# Patient Record
Sex: Female | Born: 2000 | Race: White | Hispanic: No | Marital: Single | Smoking: Never smoker
Health system: Southern US, Community
[De-identification: ages and names within clinical notes are randomized; demographics above are authoritative.]

## PROBLEM LIST (undated history)

## (undated) DIAGNOSIS — L709 Acne, unspecified: Secondary | ICD-10-CM

## (undated) HISTORY — PX: ANTERIOR CRUCIATE LIGAMENT REPAIR: SHX115

---
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
## 2018-02-17 ENCOUNTER — Other Ambulatory Visit: Payer: Self-pay | Admitting: Certified Nurse Midwife

## 2018-02-17 DIAGNOSIS — Z30011 Encounter for initial prescription of contraceptive pills: Secondary | ICD-10-CM

## 2018-02-17 NOTE — Telephone Encounter (Signed)
Appointment scheduled, patient aware and refill sent.

## 2018-02-17 NOTE — Telephone Encounter (Signed)
Needs phone call will refill one month only! If schedules and seen.

## 2018-02-17 NOTE — Telephone Encounter (Signed)
Medication refill request: Junel OCP Last AEX:  10/30/2016 Next AEX: not scheduled Last MMG (if hormonal medication request): n/a Refill authorized: #30, 0 refill until seen

## 2018-02-27 ENCOUNTER — Other Ambulatory Visit: Payer: Self-pay

## 2018-02-27 ENCOUNTER — Encounter: Payer: Self-pay | Admitting: Certified Nurse Midwife

## 2018-02-27 ENCOUNTER — Ambulatory Visit: Payer: BLUE CROSS/BLUE SHIELD | Admitting: Certified Nurse Midwife

## 2018-02-27 VITALS — BP 104/64 | HR 64 | Resp 16 | Ht 65.25 in | Wt 138.0 lb

## 2018-02-27 DIAGNOSIS — Z3041 Encounter for surveillance of contraceptive pills: Secondary | ICD-10-CM

## 2018-02-27 DIAGNOSIS — Z01419 Encounter for gynecological examination (general) (routine) without abnormal findings: Secondary | ICD-10-CM | POA: Diagnosis not present

## 2018-02-27 MED ORDER — NORETHIN ACE-ETH ESTRAD-FE 1-20 MG-MCG PO TABS: 1.0000 | ORAL_TABLET | Freq: Every day | ORAL | 11 refills | Status: DC

## 2018-02-27 NOTE — Patient Instructions (Signed)
Oral Contraception Use Oral contraceptive pills (OCPs) are medicines taken to prevent pregnancy. OCPs work by preventing the ovaries from releasing eggs. The hormones in OCPs also cause the cervical mucus to thicken, preventing the sperm from entering the uterus. The hormones also cause the uterine lining to become thin, not allowing a fertilized egg to attach to the inside of the uterus. OCPs are highly effective when taken exactly as prescribed. However, OCPs do not prevent sexually transmitted diseases (STDs). Safe sex practices, such as using condoms along with an OCP, can help prevent STDs. Before taking OCPs, you may have a physical exam and Pap test. Your health care provider may also order blood tests if necessary. Your health care provider will make sure you are a good candidate for oral contraception. Discuss with your health care provider the possible side effects of the OCP you may be prescribed. When starting an OCP, it can take 2 to 3 months for the body to adjust to the changes in hormone levels in your body. How to take oral contraceptive pills Your health care provider may advise you on how to start taking the first cycle of OCPs. Otherwise, you can:  Start on day 1 of your menstrual period. You will not need any backup contraceptive protection with this start time.  Start on the first Sunday after your menstrual period or the day you get your prescription. In these cases, you will need to use backup contraceptive protection for the first week.  Start the pill at any time of your cycle. If you take the pill within 5 days of the start of your period, you are protected against pregnancy right away. In this case, you will not need a backup form of birth control. If you start at any other time of your menstrual cycle, you will need to use another form of birth control for 7 days. If your OCP is the type called a minipill, it will protect you from pregnancy after taking it for 2 days (48  hours).  After you have started taking OCPs:  If you forget to take 1 pill, take it as soon as you remember. Take the next pill at the regular time.  If you miss 2 or more pills, call your health care provider because different pills have different instructions for missed doses. Use backup birth control until your next menstrual period starts.  If you use a 28-day pack that contains inactive pills and you miss 1 of the last 7 pills (pills with no hormones), it will not matter. Throw away the rest of the non-hormone pills and start a new pill pack.  No matter which day you start the OCP, you will always start a new pack on that same day of the week. Have an extra pack of OCPs and a backup contraceptive method available in case you miss some pills or lose your OCP pack. Follow these instructions at home:  Do not smoke.  Always use a condom to protect against STDs. OCPs do not protect against STDs.  Use a calendar to mark your menstrual period days.  Read the information and directions that came with your OCP. Talk to your health care provider if you have questions. Contact a health care provider if:  You develop nausea and vomiting.  You have abnormal vaginal discharge or bleeding.  You develop a rash.  You miss your menstrual period.  You are losing your hair.  You need treatment for mood swings or depression.  You   get dizzy when taking the OCP.  You develop acne from taking the OCP.  You become pregnant. Get help right away if:  You develop chest pain.  You develop shortness of breath.  You have an uncontrolled or severe headache.  You develop numbness or slurred speech.  You develop visual problems.  You develop pain, redness, and swelling in the legs. This information is not intended to replace advice given to you by your health care provider. Make sure you discuss any questions you have with your health care provider. Document Released: 04/25/2011 Document  Revised: 10/12/2015 Document Reviewed: 10/25/2012 Elsevier Interactive Patient Education  2017 [employer]  

## 2018-02-27 NOTE — Progress Notes (Signed)
17 y.o. G0P0000 Single  Caucasian Fe here for annual exam. Periods sporadic with OCP use, usually every 4 months, not taking continuous. Reports no missed pills or warning signs with use. No partner change and condom use. No STD concerns or testing needed. Sees Urgent care if needed. Just finished mid term exams!  Patient's last menstrual period was 01/06/2018 (exact date).          Sexually active: Yes.    The current method of family planning is OCP (estrogen/progesterone).    Exercising: Yes.    running & gym Smoker:  no  Review of Systems  Constitutional: Negative.   HENT: Negative.   Eyes: Negative.   Respiratory: Negative.   Cardiovascular: Negative.   Gastrointestinal: Negative.   Genitourinary: Negative.   Musculoskeletal: Negative.   Skin: Negative.   Neurological: Negative.   Endo/Heme/Allergies: Negative.   Psychiatric/Behavioral: Negative.     Health Maintenance: Pap:  none History of Abnormal Pap: no MMG:  none Self Breast exams: yes Colonoscopy:  none BMD:   none TDaP:  2011 Shingles: no Pneumonia: no Hep C and HIV: not done Labs: if needed   reports that she has never smoked. She has never used smokeless tobacco. She reports that she drinks about 1.0 standard drinks of alcohol per week. She reports that she does not use drugs.  Past Medical History:  Diagnosis Date  . Acne     Past Surgical History:  Procedure Laterality Date  . ANTERIOR CRUCIATE LIGAMENT REPAIR Right 09/15/2015   Procedure: RIGHT KNEE ARTHROSCOPY, ANTERIOR CRUCIATE LIGAMENT (ACL) RECONSTRUCTION WITH HAMSTRING GRAFT;  Surgeon: Cammy Copa, MD;  Location: [facility] OR;  Service: Orthopedics;  Laterality: Right;  . ANTERIOR CRUCIATE LIGAMENT REPAIR     right side    Current Outpatient Medications  Medication Sig Dispense Refill  . adapalene (DIFFERIN) 0.1 % gel Apply 1 application topically at bedtime.    . betamethasone dipropionate (DIPROLENE) 0.05 % cream APPLY TWICE A DAY TO  AFFECTED AREAS OF THE SKIN  1  . JUNEL FE 1/20 1-20 MG-MCG tablet TAKE 1 TABLET BY MOUTH ONCE DAILY AS DIRECTED 30 tablet 0  . nitrofurantoin, macrocrystal-monohydrate, (MACROBID) 100 MG capsule One capsule past coitus for UTI prevention, if symptoms persist twice daily x 3 days 30 capsule 1   No current facility-administered medications for this visit.     History reviewed. No pertinent family history.  ROS:  Pertinent items are noted in HPI.  Otherwise, a comprehensive ROS was negative.  Exam:   BP 104/64   Pulse 64   Resp 16   Ht 5' 5.25" (1.657 m)   Wt 138 lb (62.6 kg)   LMP 01/06/2018 (Exact Date)   BMI 22.79 kg/m  Height: 5' 5.25" (165.7 cm) Ht Readings from Last 3 Encounters:  02/27/18 5' 5.25" (1.657 m) (65 %, Z= 0.38)*  10/02/17 5' 5.25" (1.657 m) (65 %, Z= 0.39)*  03/06/17 5' 5.25" (1.657 m) (66 %, Z= 0.40)*   * Growth percentiles are based on CDC (Girls, 2-20 Years) data.    General appearance: alert, cooperative and appears stated age Head: Normocephalic, without obvious abnormality, atraumatic Neck: no adenopathy, supple, symmetrical, trachea midline and thyroid normal to inspection and palpation Lungs: clear to auscultation bilaterally Breasts: normal appearance, no masses or tenderness, No nipple retraction or dimpling, No nipple discharge or bleeding, No axillary or supraclavicular adenopathy Heart: regular rate and rhythm Abdomen: soft, non-tender; no masses,  no organomegaly Extremities: extremities normal, atraumatic, no  cyanosis or edema Skin: Skin color, texture, turgor normal. No rashes or lesions Lymph nodes: Cervical, supraclavicular, and axillary nodes normal. No abnormal inguinal nodes palpated Neurologic: Grossly normal   Pelvic: External genitalia:  no lesions              Urethra:  normal appearing urethra with no masses, tenderness or lesions              Bartholin's and Skene's: normal                 Vagina: normal appearing vagina with  normal color and discharge, no lesions              Cervix: no cervical motion tenderness, no lesions and nulliparous appearance              Pap taken: No. Bimanual Exam:  Uterus:  normal size, contour, position, consistency, mobility, non-tender and anteflexed              Adnexa: normal adnexa and no mass, fullness, tenderness               Rectovaginal: Confirms               Anus:  normal appearance no lesions  Chaperone present: yes  A:  Well Woman with normal exam  Contraception OCP desired  History of post coital UTI has Macrobid  P:   Reviewed health and wellness pertinent to exam  Risks/benefits/warning signs with OCP reviewed, desires continuance.  Will call if needs refill.  Pap smear: no   counseled on breast self exam, STD prevention, HIV risk factors and prevention, feminine hygiene, use and side effects of OCP's, adequate intake of calcium and vitamin D, diet and exercise  return annually or prn  An After Visit Summary was printed and given to the patient.

## 2019-01-18 ENCOUNTER — Other Ambulatory Visit: Payer: Self-pay | Admitting: Certified Nurse Midwife

## 2019-01-18 DIAGNOSIS — Z3041 Encounter for surveillance of contraceptive pills: Secondary | ICD-10-CM

## 2019-04-05 ENCOUNTER — Other Ambulatory Visit: Payer: Self-pay

## 2019-04-09 ENCOUNTER — Other Ambulatory Visit: Payer: Self-pay

## 2019-04-09 ENCOUNTER — Encounter: Payer: Self-pay | Admitting: Certified Nurse Midwife

## 2019-04-09 ENCOUNTER — Ambulatory Visit (INDEPENDENT_AMBULATORY_CARE_PROVIDER_SITE_OTHER): Payer: BLUE CROSS/BLUE SHIELD | Admitting: Certified Nurse Midwife

## 2019-04-09 VITALS — BP 104/64 | HR 68 | Temp 97.0°F | Resp 16 | Ht 65.0 in | Wt 140.0 lb

## 2019-04-09 DIAGNOSIS — Z3041 Encounter for surveillance of contraceptive pills: Secondary | ICD-10-CM | POA: Diagnosis not present

## 2019-04-09 DIAGNOSIS — Z113 Encounter for screening for infections with a predominantly sexual mode of transmission: Secondary | ICD-10-CM | POA: Diagnosis not present

## 2019-04-09 DIAGNOSIS — Z01419 Encounter for gynecological examination (general) (routine) without abnormal findings: Secondary | ICD-10-CM

## 2019-04-09 MED ORDER — NORETHIN ACE-ETH ESTRAD-FE 1-20 MG-MCG PO TABS: 1.0000 | ORAL_TABLET | Freq: Every day | ORAL | 4 refills | Status: DC

## 2019-04-09 NOTE — Progress Notes (Signed)
18 y.o. G0P0000 Single  Caucasian Fe here for annual exam. Periods normal every 3 months with continuous use OCP for cycle control. No partner change but STD screening requested. 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 Had UTI one month ago and used Macrobid post coital as instructed worked well, desires update of Rx. No other health issues today.  Patient's last menstrual period was 03/30/2019 (exact date).          Sexually active: Yes.    The current method of family planning is OCP (estrogen/progesterone).    Exercising: Yes.    running Smoker:  no  Review of Systems  Constitutional: Negative.   HENT: Negative.   Eyes: Negative.   Respiratory: Negative.   Cardiovascular: Negative.   Gastrointestinal: Negative.   Genitourinary: Negative.   Musculoskeletal: Negative.   Skin: Negative.   Neurological: Negative.   Endo/Heme/Allergies: Negative.   Psychiatric/Behavioral: Negative.     Health Maintenance: Pap:  none History of Abnormal Pap: no MMG:  none Self Breast exams: yes Colonoscopy:  none BMD:   none TDaP:  2011 Shingles: no Pneumonia: no Hep C and HIV: not done Labs: yes   reports that she has never smoked. She has never used smokeless tobacco. She reports current alcohol use of about 1.0 standard drinks of alcohol per week. She reports that she does not use drugs.  Past Medical History:  Diagnosis Date  . Acne     Past Surgical History:  Procedure Laterality Date  . ANTERIOR CRUCIATE LIGAMENT REPAIR Right 09/15/2015   Procedure: RIGHT KNEE ARTHROSCOPY, ANTERIOR CRUCIATE LIGAMENT (ACL) RECONSTRUCTION WITH HAMSTRING GRAFT;  Surgeon: Cammy Copa, MD;  Location: [facility] OR;  Service: Orthopedics;  Laterality: Right;  . ANTERIOR CRUCIATE LIGAMENT REPAIR     right side    Current Outpatient Medications  Medication Sig Dispense Refill  . adapalene (DIFFERIN) 0.1 % gel Apply 1 application topically at bedtime.    . betamethasone dipropionate (DIPROLENE) 0.05 % cream  APPLY TWICE A DAY TO AFFECTED AREAS OF THE SKIN  1  . nitrofurantoin, macrocrystal-monohydrate, (MACROBID) 100 MG capsule One capsule past coitus for UTI prevention, if symptoms persist twice daily x 3 days 30 capsule 1  . norethindrone-ethinyl estradiol (JUNEL FE 1/20) 1-20 MG-MCG tablet Take 1 tablet by mouth daily. as directed 30 tablet 11   No current facility-administered medications for this visit.     History reviewed. No pertinent family history.  ROS:  Pertinent items are noted in HPI.  Otherwise, a comprehensive ROS was negative.  Exam:   BP 104/64   Pulse 68   Temp (!) 97 F (36.1 C) (Skin)   Resp 16   Ht 5\' 5"  (1.651 m)   Wt 140 lb (63.5 kg)   LMP 03/30/2019 (Exact Date)   BMI 23.30 kg/m  Height: 5\' 5"  (165.1 cm) Ht Readings from Last 3 Encounters:  04/09/19 5\' 5"  (1.651 m)  02/27/18 5' 5.25" (1.657 m) (65 %, Z= 0.38)*  10/02/17 5' 5.25" (1.657 m) (65 %, Z= 0.39)*   * Growth percentiles are based on CDC (Girls, 2-20 Years) data.    General appearance: alert, cooperative and appears stated age Head: Normocephalic, without obvious abnormality, atraumatic Neck: no adenopathy, supple, symmetrical, trachea midline and thyroid normal to inspection and palpation Lungs: clear to auscultation bilaterally Breasts: normal appearance, no masses or tenderness, No nipple retraction or dimpling, No nipple discharge or bleeding, No axillary or supraclavicular adenopathy Heart: regular rate and rhythm Abdomen: soft, non-tender; no  masses,  no organomegaly Extremities: extremities normal, atraumatic, no cyanosis or edema Skin: Skin color, texture, turgor normal. No rashes or lesions Lymph nodes: Cervical, supraclavicular, and axillary nodes normal. No abnormal inguinal nodes palpated Neurologic: Grossly normal   Pelvic: External genitalia:  no lesions              Urethra:  normal appearing urethra with no masses, tenderness or lesions              Bartholin's and Skene's:  normal                 Vagina: normal appearing vagina with normal color and discharge, no lesions              Cervix: no bleeding following Pap, no cervical motion tenderness, no lesions and nulliparous appearance              Pap taken: No. Bimanual Exam:  Uterus:  normal size, contour, position, consistency, mobility, non-tender and anteverted              Adnexa: normal adnexa and no mass, fullness, tenderness               Rectovaginal: Confirms               Anus:  normal sphincter tone, no lesions  Chaperone present: yes  A:  Well Woman with normal exam  Contraception OCP continuous use desired  Post coital UTI history, Macrobid working well, needs Rx update  STD screening    P:   Reviewed health and wellness pertinent to exam  Risks/benefits/warning signs given and need to have cycle every 3rd month to avoid BTB.  Rx Junel 1/20 Fe see order with instructions  Rx Macrobid see order with instructions  Labs: Affirm, Gc/Chlamydia, HIV,RPR, Hep C  Pap smear: no age 39   counseled on breast self exam, STD prevention, HIV risk factors and prevention, feminine hygiene, use and side effects of OCP's, adequate intake of calcium and vitamin D, diet and exercise  return annually or prn  An After Visit Summary was printed and given to the patient.

## 2019-04-09 NOTE — Patient Instructions (Signed)
General topics  Next pap or exam is  due in 1 year Take a Women's multivitamin Take 1200 mg. of calcium daily - prefer dietary If any concerns in interim to call back  Breast Self-Awareness Practicing breast self-awareness may pick up problems early, prevent significant medical complications, and possibly save your life. By practicing breast self-awareness, you can become familiar with how your breasts look and feel and if your breasts are changing. This allows you to notice changes early. It can also offer you some reassurance that your breast health is good. One way to learn what is normal for your breasts and whether your breasts are changing is to do a breast self-exam. If you find a lump or something that was not present in the past, it is best to contact your caregiver right away. Other findings that should be evaluated by your caregiver include nipple discharge, especially if it is bloody; skin changes or reddening; areas where the skin seems to be pulled in (retracted); or new lumps and bumps. Breast pain is seldom associated with cancer (malignancy), but should also be evaluated by a caregiver. BREAST SELF-EXAM The best time to examine your breasts is 5 7 days after your menstrual period is over.  ExitCare Patient Information 2013 [place].   Exercise to Stay Healthy Exercise helps you become and stay healthy. EXERCISE IDEAS AND TIPS Choose exercises that:  You enjoy.  Fit into your day. You do not need to exercise really hard to be healthy. You can do exercises at a slow or medium level and stay healthy. You can:  Stretch before and after working out.  Try yoga, Pilates, or tai chi.  Lift weights.  Walk fast, swim, jog, run, climb stairs, bicycle, dance, or rollerskate.  Take aerobic classes. Exercises that burn about 150 calories:  Running 1  miles in 15 minutes.  Playing volleyball for 45 to 60 minutes.  Washing and waxing a car for 45 to 60 minutes.   Playing touch football for 45 minutes.  Walking 1  miles in 35 minutes.  Pushing a stroller 1  miles in 30 minutes.  Playing basketball for 30 minutes.  Raking leaves for 30 minutes.  Bicycling 5 miles in 30 minutes.  Walking 2 miles in 30 minutes.  Dancing for 30 minutes.  Shoveling snow for 15 minutes.  Swimming laps for 20 minutes.  Walking up stairs for 15 minutes.  Bicycling 4 miles in 15 minutes.  Gardening for 30 to 45 minutes.  Jumping rope for 15 minutes.  Washing windows or floors for 45 to 60 minutes. Document Released: 06/08/2010 Document Revised: 07/29/2011 Document Reviewed: 06/08/2010 [facility] Patient Information 2013 [place].   Other topics ( that may be useful information):    Sexually Transmitted Disease Sexually transmitted disease (STD) refers to any infection that is passed from person to person during sexual activity. This may happen by way of saliva, semen, blood, vaginal mucus, or urine. Common STDs include:  Gonorrhea.  Chlamydia.  Syphilis.  HIV/AIDS.  Genital herpes.  Hepatitis B and C.  Trichomonas.  Human papillomavirus (HPV).  Pubic lice. CAUSES  An STD may be spread by bacteria, virus, or parasite. A person can get an STD by:  Sexual intercourse with an infected person.  Sharing sex toys with an infected person.  Sharing needles with an infected person.  Having intimate contact with the genitals, mouth, or rectal areas of an infected person. SYMPTOMS  Some people may not have any symptoms, but  they can still pass the infection to others. Different STDs have different symptoms. Symptoms include:  Painful or bloody urination.  Pain in the pelvis, abdomen, vagina, anus, throat, or eyes.  Skin rash, itching, irritation, growths, or sores (lesions). These usually occur in the genital or anal area.  Abnormal vaginal discharge.  Penile discharge in men.  Soft, flesh-colored skin growths in the genital  or anal area.  Fever.  Pain or bleeding during sexual intercourse.  Swollen glands in the groin area.  Yellow skin and eyes (jaundice). This is seen with hepatitis. DIAGNOSIS  To make a diagnosis, your caregiver may:  Take a medical history.  Perform a physical exam.  Take a specimen (culture) to be examined.  Examine a sample of discharge under a microscope.  Perform blood test TREATMENT   Chlamydia, gonorrhea, trichomonas, and syphilis can be cured with antibiotic medicine.  Genital herpes, hepatitis, and HIV can be treated, but not cured, with prescribed medicines. The medicines will lessen the symptoms.  Genital warts from HPV can be treated with medicine or by freezing, burning (electrocautery), or surgery. Warts may come back.  HPV is a virus and cannot be cured with medicine or surgery.However, abnormal areas may be followed very closely by your caregiver and may be removed from the cervix, vagina, or vulva through office procedures or surgery. If your diagnosis is confirmed, your recent sexual partners need treatment. This is true even if they are symptom-free or have a negative culture or evaluation. They should not have sex until their caregiver says it is okay. HOME CARE INSTRUCTIONS  All sexual partners should be informed, tested, and treated for all STDs.  Take your antibiotics as directed. Finish them even if you start to feel better.  Only take over-the-counter or prescription medicines for pain, discomfort, or fever as directed by your caregiver.  Rest.  Eat a balanced diet and drink enough fluids to keep your urine clear or pale yellow.  Do not have sex until treatment is completed and you have followed up with your caregiver. STDs should be checked after treatment.  Keep all follow-up appointments, Pap tests, and blood tests as directed by your caregiver.  Only use latex condoms and water-soluble lubricants during sexual activity. Do not use petroleum  jelly or oils.  Avoid alcohol and illegal drugs.  Get vaccinated for HPV and hepatitis. If you have not received these vaccines in the past, talk to your caregiver about whether one or both might be right for you.  Avoid risky sex practices that can break the skin. The only way to avoid getting an STD is to avoid all sexual activity.Latex condoms and dental dams (for oral sex) will help lessen the risk of getting an STD, but will not completely eliminate the risk. SEEK MEDICAL CARE IF:   You have a fever.  You have any new or worsening symptoms. Document Released: 07/27/2002 Document Revised: 07/29/2011 Document Reviewed: 08/03/2010 [facility] Patient Information 2013 [place].    Domestic Abuse You are being battered or abused if someone close to you hits, pushes, or physically hurts you in any way. You also are being abused if you are forced into activities. You are being sexually abused if you are forced to have sexual contact of any kind. You are being emotionally abused if you are made to feel worthless or if you are constantly threatened. It is important to remember that help is available. No one has the right to abuse you. PREVENTION OF FURTHER  ABUSE  Learn the warning signs of danger. This varies with situations but may include: the use of alcohol, threats, isolation from friends and family, or forced sexual contact. Leave if you feel that violence is going to occur.  If you are attacked or beaten, report it to the police so the abuse is documented. You do not have to press charges. The police can protect you while you or the attackers are leaving. Get the officer's name and badge number and a copy of the report.  Find someone you can trust and tell them what is happening to you: your caregiver, a nurse, clergy member, close friend or family member. Feeling ashamed is natural, but remember that you have done nothing wrong. No one deserves abuse. Document Released: 05/03/2000  Document Revised: 07/29/2011 Document Reviewed: 07/12/2010 [facility] Patient Information 2013 [place].    How Much is Too Much Alcohol? Drinking too much alcohol can cause injury, accidents, and health problems. These types of problems can include:   Car crashes.  Falls.  Family fighting (domestic violence).  Drowning.  Fights.  Injuries.  Burns.  Damage to certain organs.  Having a baby with birth defects. ONE DRINK CAN BE TOO MUCH WHEN YOU ARE:  Working.  Pregnant or breastfeeding.  Taking medicines. Ask your doctor.  Driving or planning to drive. If you or someone you know has a drinking problem, get help from a doctor.  Document Released: 03/02/2009 Document Revised: 07/29/2011 Document Reviewed: 03/02/2009 [facility] Patient Information 2013 [place].   Smoking Hazards Smoking cigarettes is extremely bad for your health. Tobacco smoke has over 200 known poisons in it. There are over 60 chemicals in tobacco smoke that cause cancer. Some of the chemicals found in cigarette smoke include:   Cyanide.  Benzene.  Formaldehyde.  Methanol (wood alcohol).  Acetylene (fuel used in welding torches).  Ammonia. Cigarette smoke also contains the poisonous gases nitrogen oxide and carbon monoxide.  Cigarette smokers have an increased risk of many serious medical problems and Smoking causes approximately:  90% of all lung cancer deaths in men.  80% of all lung cancer deaths in women.  90% of deaths from chronic obstructive lung disease. Compared with nonsmokers, smoking increases the risk of:  Coronary heart disease by 2 to 4 times.  Stroke by 2 to 4 times.  Men developing lung cancer by 23 times.  Women developing lung cancer by 13 times.  Dying from chronic obstructive lung diseases by 12 times.  . Smoking is the most preventable cause of death and disease in our society.  WHY IS SMOKING ADDICTIVE?  Nicotine is the chemical agent in  tobacco that is capable of causing addiction or dependence.  When you smoke and inhale, nicotine is absorbed rapidly into the bloodstream through your lungs. Nicotine absorbed through the lungs is capable of creating a powerful addiction. Both inhaled and non-inhaled nicotine may be addictive.  Addiction studies of cigarettes and spit tobacco show that addiction to nicotine occurs mainly during the teen years, when young people begin using tobacco products. WHAT ARE THE BENEFITS OF QUITTING?  There are many health benefits to quitting smoking.   Likelihood of developing cancer and heart disease decreases. Health improvements are seen almost immediately.  Blood pressure, pulse rate, and breathing patterns start returning to normal soon after quitting. QUITTING SMOKING   American Lung Association - 1-800-LUNGUSA  American Cancer Society - 1-800-ACS-2345 Document Released: 06/13/2004 Document Revised: 07/29/2011 Document Reviewed: 02/15/2009 [facility] Patient Information 2013 [place],  [employer].   Stress Management Stress is a state of physical or mental tension that often results from changes in your life or normal routine. Some common causes of stress are:  Death of a loved one.  Injuries or severe illnesses.  Getting fired or changing jobs.  Moving into a new home. Other causes may be:  Sexual problems.  Business or financial losses.  Taking on a large debt.  Regular conflict with someone at home or at work.  Constant tiredness from lack of sleep. It is not just bad things that are stressful. It may be stressful to:  Win the lottery.  Get married.  Buy a new car. The amount of stress that can be easily tolerated varies from person to person. Changes generally cause stress, regardless of the types of change. Too much stress can affect your health. It may lead to physical or emotional problems. Too little stress (boredom) may also become stressful. SUGGESTIONS TO REDUCE  STRESS:  Talk things over with your family and friends. It often is helpful to share your concerns and worries. If you feel your problem is serious, you may want to get help from a professional counselor.  Consider your problems one at a time instead of lumping them all together. Trying to take care of everything at once may seem impossible. List all the things you need to do and then start with the most important one. Set a goal to accomplish 2 or 3 things each day. If you expect to do too many in a single day you will naturally fail, causing you to feel even more stressed.  Do not use alcohol or drugs to relieve stress. Although you may feel better for a short time, they do not remove the problems that caused the stress. They can also be habit forming.  Exercise regularly - at least 3 times per week. Physical exercise can help to relieve that "uptight" feeling and will relax you.  The shortest distance between despair and hope is often a good night's sleep.  Go to bed and get up on time allowing yourself time for appointments without being rushed.  Take a short "time-out" period from any stressful situation that occurs during the day. Close your eyes and take some deep breaths. Starting with the muscles in your face, tense them, hold it for a few seconds, then relax. Repeat this with the muscles in your neck, shoulders, hand, stomach, back and legs.  Take good care of yourself. Eat a balanced diet and get plenty of rest.  Schedule time for having fun. Take a break from your daily routine to relax. HOME CARE INSTRUCTIONS   Call if you feel overwhelmed by your problems and feel you can no longer manage them on your own.  Return immediately if you feel like hurting yourself or someone else. Document Released: 10/30/2000 Document Revised: 07/29/2011 Document Reviewed: 06/22/2007 Exit[employer] Patient Information 2013 [employer].   Preventing Sexually Transmitted Infections, Adult Sexually  transmitted infections (STIs) are diseases that are passed (transmitted) from person to person through bodily fluids exchanged during sex or sexual contact. Bodily fluids include saliva, semen, blood, vaginal mucus, and urine. You may have an increased risk for developing an STI if you have unprotected oral, vaginal, or anal sex. Some common STIs include:  Herpes.  Hepatitis B.  Chlamydia.  Gonorrhea.  Syphilis.  HPV (human papillomavirus).  HIV (human immunodeficiency virus), the virus that can cause AIDS (acquired immunodeficiency syndrome). How can I protect myself from   sexually transmitted infections? The only way to completely prevent STIs is not to have sex of any kind (practice abstinence). This includes oral, vaginal, or anal sex. If you are sexually active, take these actions to lower your risk of getting an STI:  Have only one sex partner (be monogamous) or limit the number of sexual partners you have.  Stay up-to-date on immunizations. Certain vaccines can lower your risk of getting certain STIs, such as: ? Hepatitis A and B vaccines. You may have been vaccinated as a young child, but likely need a booster shot as a teen or young adult. ? HPV vaccine.  Use methods that prevent the exchange of body fluids between partners (barrier protection) every time you have sex. Barrier protection can be used during oral, vaginal, or anal sex. Commonly used barrier methods include: ? Female condom. ? Female condom. ? Dental dam.  Get tested regularly for STIs. Have your sexual partner get tested regularly as well.  Avoid mixing alcohol, drugs, and sex. Alcohol and drug use can affect your ability to make good decisions and can lead to risky sexual behaviors.  Ask your health care provider about taking pre-exposure prophylaxis (PrEP) to prevent HIV infection if you: ? Have a HIV-positive sexual partner. ? Have multiple sexual partners or partners who do not know their HIV status, and  do not regularly use a condom during sex. ? Use injection drugs and share needles. Birth control pills, injections, implants, and intrauterine devices (IUDs) do not protect against STIs. To prevent both STIs and pregnancy, always use a condom with another form of birth control. Some STIs, such as herpes, are spread through skin to skin contact. A condom does not protect you from getting such STIs. If you or your partner have herpes and there is an active flare with open sores, avoid all sexual contact. Why are these changes important? Taking steps to practice safe sex protects you and others. Many STIs can be cured. However, some STIs are not curable and will affect you for the rest of your life. STIs can be passed on to another person even if you do not have symptoms. What can happen if changes are not made? Certain STIs may:  Require you to take medicine for the rest of your life.  Affect your ability to have children (your fertility).  Increase your risk for developing another STI or certain serious health conditions, such as: ? Cervical cancer. ? Head and neck cancer. ? Pelvic inflammatory disease (PID) in women. ? Organ damage or damage to other parts of your body, if the infection spreads.  Be passed to a baby during childbirth. How are sexually transmitted infections treated? If you or your partner know or think that you may have an STI:  Talk with your health care provider about what can be done to treat it. Some STIs can be treated and cured with medicines.  For curable STIs, you and your partner should avoid sex during treatment and for several days after treatment is complete.  You and your partner should both be treated at the same time, if there is any chance that your partner is infected as well. If you get treatment but your partner does not, your partner can re-infect you when you resume sexual contact.  Do not have unprotected sex. Where to find more information Learn  more about sexually transmitted diseases and infections from:  Centers for Disease Control and Prevention: ? More information about specific STIs: www.cdc.gov/std ? Find   places to get sexual health counseling and treatment for free or for a low cost: gettested.cdc.gov  U.S. Department of Health and Human Services: www.womenshealth.gov/publications/our-publications/fact-sheet/sexually-transmitted-infections.html Summary  The only way to completely prevent STIs is not to have sex (practice abstinence), including oral, vaginal, or anal sex.  STIs can spread through saliva, semen, blood, vaginal mucus, urine, or sexual contact.  If you do have sex, limit your number of sexual partners and use a barrier protection method every time you have sex.  If you develop an STI, get treated right away and ask your partner to be treated as well. Do not resume having sex until both of you have completed treatment for the STI. This information is not intended to replace advice given to you by your health care provider. Make sure you discuss any questions you have with your health care provider. Document Released: 05/02/2016 Document Revised: 10/10/2017 Document Reviewed: 05/02/2016 Elsevier Patient Education  2020 Elsevier Inc.  

## 2019-04-10 LAB — RPR: RPR Ser Ql: NONREACTIVE

## 2019-04-10 LAB — HIV ANTIBODY (ROUTINE TESTING W REFLEX): HIV Screen 4th Generation wRfx: NONREACTIVE

## 2019-04-10 LAB — VAGINITIS/VAGINOSIS, DNA PROBE
Candida Species: POSITIVE — AB
Gardnerella vaginalis: POSITIVE — AB
Trichomonas vaginosis: NEGATIVE

## 2019-04-10 LAB — HEPATITIS C ANTIBODY: Hep C Virus Ab: <0.1 s/co ratio (ref 0.0–0.9)

## 2019-04-12 ENCOUNTER — Telehealth: Payer: Self-pay

## 2019-04-12 NOTE — Telephone Encounter (Signed)
-----   Message from Regina Eck, CNM sent at 04/11/2019  3:56 PM EST ----- Notify patient that her vaginal screening was negative for trichomonas, but positive for BV and yeast. She will need Rx Metrogel Bid x 7 and Rx Diflucan 150 mg one today and repeat one in 5 days.  She should expect increase in vaginal discharge during treatment. She can do [employer] bath to help with this. Chlamydia and Gonorrhea pending

## 2019-04-12 NOTE — Telephone Encounter (Signed)
Left message for call back.

## 2019-04-13 NOTE — Telephone Encounter (Signed)
Left message for call back.

## 2019-04-14 LAB — GC/CHLAMYDIA PROBE AMP
Chlamydia trachomatis, NAA: NEGATIVE
Neisseria Gonorrhoeae by PCR: NEGATIVE

## 2019-04-14 NOTE — Telephone Encounter (Signed)
Left message for call back.

## 2019-04-19 MED ORDER — FLUCONAZOLE 150 MG PO TABS: ORAL_TABLET | ORAL | 0 refills | Status: DC

## 2019-04-19 MED ORDER — METRONIDAZOLE 0.75 % VA GEL: 1.0000 | Freq: Two times a day (BID) | VAGINAL | 0 refills | Status: AC

## 2019-04-19 NOTE — Telephone Encounter (Signed)
Patient notified of results & rxs sent to pharmacy. 

## 2019-05-24 ENCOUNTER — Ambulatory Visit: Payer: BLUE CROSS/BLUE SHIELD | Attending: Internal Medicine

## 2019-05-24 DIAGNOSIS — Z20822 Contact with and (suspected) exposure to covid-19: Secondary | ICD-10-CM

## 2019-05-25 LAB — NOVEL CORONAVIRUS, NAA: SARS-CoV-2, NAA: NOT DETECTED

## 2019-06-02 ENCOUNTER — Telehealth: Payer: Self-pay | Admitting: Family Medicine

## 2019-06-02 ENCOUNTER — Ambulatory Visit: Payer: BLUE CROSS/BLUE SHIELD | Attending: Internal Medicine

## 2019-06-02 DIAGNOSIS — Z20822 Contact with and (suspected) exposure to covid-19: Secondary | ICD-10-CM

## 2019-06-02 NOTE — Telephone Encounter (Signed)
Have her see Chip Boer

## 2019-06-02 NOTE — Telephone Encounter (Signed)
Pts mother called and said pts Nutritionist is requiring her to have physical done as soon as possible with a specific set of labs. Pt stated that she would be fine seeing Vickie if she can get in with her sooner. But we are not in network with pts insurance and they are aware and prepared to pay for visit.

## 2019-06-04 LAB — NOVEL CORONAVIRUS, NAA: SARS-CoV-2, NAA: NOT DETECTED

## 2019-07-02 ENCOUNTER — Encounter: Payer: Self-pay | Admitting: Family Medicine

## 2019-07-02 ENCOUNTER — Other Ambulatory Visit: Payer: Self-pay

## 2019-07-02 ENCOUNTER — Ambulatory Visit (INDEPENDENT_AMBULATORY_CARE_PROVIDER_SITE_OTHER): Payer: BLUE CROSS/BLUE SHIELD | Admitting: Family Medicine

## 2019-07-02 VITALS — BP 120/72 | HR 72 | Temp 97.3°F | Ht 66.0 in | Wt 149.4 lb

## 2019-07-02 DIAGNOSIS — F5001 Anorexia nervosa, restricting type: Secondary | ICD-10-CM | POA: Diagnosis not present

## 2019-07-02 LAB — POCT URINALYSIS DIP (PROADVANTAGE DEVICE)
Bilirubin, UA: NEGATIVE
Blood, UA: NEGATIVE
Glucose, UA: NEGATIVE mg/dL
Ketones, POC UA: NEGATIVE mg/dL
Leukocytes, UA: NEGATIVE
Nitrite, UA: NEGATIVE
Protein Ur, POC: NEGATIVE mg/dL
Specific Gravity, Urine: 1.02
Urobilinogen, Ur: NEGATIVE
pH, UA: 6 (ref 5.0–8.0)

## 2019-07-02 NOTE — Progress Notes (Signed)
   Subjective:    Patient ID: Leah Harrison, female    DOB: 09-Sep-2000, 19 y.o.   MRN: [id number]  HPI Chief Complaint  Patient presents with  . consult    nutrionist is requesting blood pressure, urine, EKG, cbc,cmp,mg and phosphorus- has eating disorder   She is here requesting labs, urinalysis and EKG. States her nutritionist is treating her for anorexia and requests that she have these tests done. She had a CPE last week but did not know she had to have these tests.   States her nutritionist is Orthoptist. She saw her in the past, 2014 approximately and just recently started seeing her again.   States she is also seeing Mike Craze, therapist.   States she has been treated for anorexia, calorie restricting off and on since 2014.   Bowel movements 1-2 times per day. Denies laxative use.   Is not currently sexually active.   LMP: October 2020.  Periods are irregular.  Has an OB/GYN. Is on OCPs.   Denies fever, chills, dizziness, syncope,  headache, chest pain, palpitations, shortness of breath, abdominal pain, N/V/D, urinary symptoms.   Reports history of recurrent UTIs but does not have any symptoms today.   Reviewed allergies, medications, past medical, surgical, family, and social history.    Review of Systems Pertinent positives and negatives in the history of present illness.     Objective:   Physical Exam BP 120/72   Pulse 72   Temp (!) 97.3 F (36.3 C)   Ht 5\' 6"  (1.676 m)   Wt 149 lb 6.4 oz (67.8 kg)   SpO2 99%   BMI 24.11 kg/m   Alert and in no distress. Cardiac exam shows a regular sinus rhythm without murmurs or gallops. Lungs are clear to auscultation. Skin is warm and dry. PERRLA, EOMs intact. No        Assessment & Plan:  Anorexia nervosa, restricting type - Plan: EKG 12-Lead, POCT Urinalysis DIP (Proadvantage Device), CBC with Differential/Platelet, Comprehensive metabolic panel, Magnesium, Phosphorus  ECG shows NSR, vent rate 73, PR  interval 132, QRS duration 100, QT/QTc 404/445, P-R-T axes 5/52/51and  incomplete RBBB  Labs, UA and EKG done per nutritionist request. Will follow up pending results.  She will follow up with her nutritionist and

## 2019-07-03 LAB — CBC WITH DIFFERENTIAL/PLATELET
Basophils Absolute: 0.1 10*3/uL (ref 0.0–0.2)
Basos: 1 %
EOS (ABSOLUTE): 0.2 10*3/uL (ref 0.0–0.4)
Eos: 3 %
Hematocrit: 42.5 % (ref 34.0–46.6)
Hemoglobin: 14.2 g/dL (ref 11.1–15.9)
Immature Grans (Abs): 0 10*3/uL (ref 0.0–0.1)
Immature Granulocytes: 0 %
Lymphocytes Absolute: 2.9 10*3/uL (ref 0.7–3.1)
Lymphs: 38 %
MCH: 29.3 pg (ref 26.6–33.0)
MCHC: 33.4 g/dL (ref 31.5–35.7)
MCV: 88 fL (ref 79–97)
Monocytes Absolute: 0.5 10*3/uL (ref 0.1–0.9)
Monocytes: 7 %
Neutrophils Absolute: 3.9 10*3/uL (ref 1.4–7.0)
Neutrophils: 51 %
Platelets: 268 10*3/uL (ref 150–450)
RBC: 4.84 x10E6/uL (ref 3.77–5.28)
RDW: 12 % (ref 11.7–15.4)
WBC: 7.6 10*3/uL (ref 3.4–10.8)

## 2019-07-03 LAB — COMPREHENSIVE METABOLIC PANEL
ALT: 12 IU/L (ref 0–32)
AST: 18 IU/L (ref 0–40)
Albumin/Globulin Ratio: 1.9 (ref 1.2–2.2)
Albumin: 4.2 g/dL (ref 3.9–5.0)
Alkaline Phosphatase: 49 IU/L (ref 39–117)
BUN/Creatinine Ratio: 10 (ref 9–23)
BUN: 9 mg/dL (ref 6–20)
Bilirubin Total: <0.2 mg/dL (ref 0.0–1.2)
CO2: 25 mmol/L (ref 20–29)
Calcium: 9.3 mg/dL (ref 8.7–10.2)
Chloride: 106 mmol/L (ref 96–106)
Creatinine, Ser: 0.86 mg/dL (ref 0.57–1.00)
GFR calc Af Amer: 112 mL/min/{1.73_m2} (ref >59)
GFR calc non Af Amer: 98 mL/min/{1.73_m2} (ref >59)
Globulin, Total: 2.2 g/dL (ref 1.5–4.5)
Glucose: 95 mg/dL (ref 65–99)
Potassium: 4.1 mmol/L (ref 3.5–5.2)
Sodium: 143 mmol/L (ref 134–144)
Total Protein: 6.4 g/dL (ref 6.0–8.5)

## 2019-07-03 LAB — PHOSPHORUS: Phosphorus: 3.8 mg/dL (ref 3.0–4.3)

## 2019-07-03 LAB — MAGNESIUM: Magnesium: 2.1 mg/dL (ref 1.6–2.3)

## 2019-08-02 ENCOUNTER — Encounter: Payer: Self-pay | Admitting: Certified Nurse Midwife

## 2019-08-04 ENCOUNTER — Encounter: Payer: Self-pay | Admitting: Certified Nurse Midwife

## 2020-05-09 NOTE — Progress Notes (Signed)
19 y.o. G0P0000 Single White or Caucasian female here for annual exam.    Is a senior at [employer], studying history and Investment banker, corporate. Applying to grad school, thinking about law school  Denies problems today. Not sexually active at this time Likes her current pill, denies problems with period   No LMP recorded. (Menstrual status: Oral contraceptives).          Sexually active: Yes.    The current method of family planning is OCP (estrogen/progesterone).    Exercising: Yes.    running & gym Smoker:  no  Health Maintenance: Pap:  none History of abnormal Pap:  no MMG:  none Colonoscopy:  none BMD:   none TDaP:  2018 Gardasil:   Definitely had 2, unsure if 3rd one was given Covid-19: pfizer Hep C testing: neg 2020    reports that she has never smoked. She has never used smokeless tobacco. She reports current alcohol use of about 1.0 standard drink of alcohol per week. She reports that she does not use drugs.  Past Medical History:  Diagnosis Date  . Acne     Past Surgical History:  Procedure Laterality Date  . ANTERIOR CRUCIATE LIGAMENT REPAIR Right 09/15/2015   Procedure: RIGHT KNEE ARTHROSCOPY, ANTERIOR CRUCIATE LIGAMENT (ACL) RECONSTRUCTION WITH HAMSTRING GRAFT;  Surgeon: Cammy Copa, MD;  Location: [facility] OR;  Service: Orthopedics;  Laterality: Right;  . ANTERIOR CRUCIATE LIGAMENT REPAIR     right side    Current Outpatient Medications  Medication Sig Dispense Refill  . adapalene (DIFFERIN) 0.1 % gel Apply 1 application topically at bedtime.    Marland Kitchen FLUoxetine (PROZAC) 20 MG capsule Take 20 mg by mouth daily.    . norethindrone-ethinyl estradiol (JUNEL FE 1/20) 1-20 MG-MCG tablet Take 1 tablet by mouth daily. as directed 3 Package 4   No current facility-administered medications for this visit.    History reviewed. No pertinent family history.  Review of Systems  Constitutional: Negative.   HENT: Negative.   Eyes: Negative.   Respiratory: Negative.    Cardiovascular: Negative.   Gastrointestinal: Negative.   Endocrine: Negative.   Genitourinary: Negative.   Musculoskeletal: Negative.   Skin: Negative.   Allergic/Immunologic: Negative.   Neurological: Negative.   Hematological: Negative.   Psychiatric/Behavioral: Negative.     Exam:   BP 116/78   Pulse 70   Resp 16   Ht 5' 5.25" (1.657 m)   Wt 148 lb (67.1 kg)   BMI 24.44 kg/m   Height: 5' 5.25" (165.7 cm)  General appearance: alert, cooperative and appears stated age, no acute distress Head: Normocephalic, without obvious abnormality Neck: no adenopathy, thyroid normal to inspection and palpation Lungs: clear to auscultation bilaterally Breasts: normal appearance, no masses or tenderness Heart: regular rate and rhythm Abdomen: soft, non-tender; no masses,  no organomegaly Extremities: extremities normal, no edema Skin: body ance noted (under care of derm) Lymph nodes: Cervical, supraclavicular, and axillary nodes normal. No abnormal inguinal nodes palpated Neurologic: Grossly normal   Pelvic: External genitalia:  no lesions              Urethra:  normal appearing urethra with no masses, tenderness or lesions              Bartholins and Skenes: normal                 Vagina: normal appearing vagina, appropriate for age, small amount of yellow discharge, no lesions  Cervix: neg cervical motion tenderness, no visible lesions             Bimanual Exam:   Uterus:  normal size, contour, position, consistency, mobility, non-tender              Adnexa: no mass, fullness, tenderness                 Ladona Ridgel, CMA Chaperone was present for exam.  A:  Well Woman with normal exam  P:   Pap :collected today with GC/CT   Medications: Junel refill, #84/4 RF  F/U 1 year

## 2020-05-10 ENCOUNTER — Inpatient Hospital Stay (HOSPITAL_COMMUNITY): Admit: 2020-05-10 | Payer: Self-pay

## 2020-05-10 ENCOUNTER — Encounter: Payer: Self-pay | Admitting: Nurse Practitioner

## 2020-05-10 ENCOUNTER — Ambulatory Visit (INDEPENDENT_AMBULATORY_CARE_PROVIDER_SITE_OTHER): Payer: BLUE CROSS/BLUE SHIELD | Admitting: Nurse Practitioner

## 2020-05-10 ENCOUNTER — Other Ambulatory Visit: Payer: Self-pay

## 2020-05-10 VITALS — BP 116/78 | HR 70 | Resp 16 | Ht 65.25 in | Wt 148.0 lb

## 2020-05-10 DIAGNOSIS — Z01419 Encounter for gynecological examination (general) (routine) without abnormal findings: Secondary | ICD-10-CM

## 2020-05-10 DIAGNOSIS — Z3041 Encounter for surveillance of contraceptive pills: Secondary | ICD-10-CM | POA: Diagnosis not present

## 2020-05-10 MED ORDER — NORETHIN ACE-ETH ESTRAD-FE 1-20 MG-MCG PO TABS: 1.0000 | ORAL_TABLET | Freq: Every day | ORAL | 4 refills | Status: DC

## 2020-05-10 NOTE — Patient Instructions (Signed)
Health Maintenance, Female Adopting a healthy lifestyle and getting preventive care are important in promoting health and wellness. Ask your health care provider about:  The right schedule for you to have regular tests and exams.  Things you can do on your own to prevent diseases and keep yourself healthy. What should I know about diet, weight, and exercise? Eat a healthy diet   Eat a diet that includes plenty of vegetables, fruits, low-fat dairy products, and lean protein.  Do not eat a lot of foods that are high in solid fats, added sugars, or sodium. Maintain a healthy weight Body mass index (BMI) is used to identify weight problems. It estimates body fat based on height and weight. Your health care provider can help determine your BMI and help you achieve or maintain a healthy weight. Get regular exercise Get regular exercise. This is one of the most important things you can do for your health. Most adults should:  Exercise for at least 150 minutes each week. The exercise should increase your heart rate and make you sweat (moderate-intensity exercise).  Do strengthening exercises at least twice a week. This is in addition to the moderate-intensity exercise.  Spend less time sitting. Even light physical activity can be beneficial. Watch cholesterol and blood lipids Have your blood tested for lipids and cholesterol at 20 years of age, then have this test every 5 years. Have your cholesterol levels checked more often if:  Your lipid or cholesterol levels are high.  You are older than 21 years of age.  You are at high risk for heart disease. What should I know about cancer screening? Depending on your health history and family history, you may need to have cancer screening at various ages. This may include screening for:  Breast cancer.  Cervical cancer.  Colorectal cancer.  Skin cancer.  Lung cancer. What should I know about heart disease, diabetes, and high blood  pressure? Blood pressure and heart disease  High blood pressure causes heart disease and increases the risk of stroke. This is more likely to develop in people who have high blood pressure readings, are of African descent, or are overweight.  Have your blood pressure checked: ? Every 3-5 years if you are 18-39 years of age. ? Every year if you are 40 years old or older. Diabetes Have regular diabetes screenings. This checks your fasting blood sugar level. Have the screening done:  Once every three years after age 40 if you are at a normal weight and have a low risk for diabetes.  More often and at a younger age if you are overweight or have a high risk for diabetes. What should I know about preventing infection? Hepatitis B If you have a higher risk for hepatitis B, you should be screened for this virus. Talk with your health care provider to find out if you are at risk for hepatitis B infection. Hepatitis C Testing is recommended for:  Everyone born from 1945 through 1965.  Anyone with known risk factors for hepatitis C. Sexually transmitted infections (STIs)  Get screened for STIs, including gonorrhea and chlamydia, if: ? You are sexually active and are younger than 21 years of age. ? You are older than 21 years of age and your health care provider tells you that you are at risk for this type of infection. ? Your sexual activity has changed since you were last screened, and you are at increased risk for chlamydia or gonorrhea. Ask your health care provider if   you are at risk.  Ask your health care provider about whether you are at high risk for HIV. Your health care provider may recommend a prescription medicine to help prevent HIV infection. If you choose to take medicine to prevent HIV, you should first get tested for HIV. You should then be tested every 3 months for as long as you are taking the medicine. Pregnancy  If you are about to stop having your period (premenopausal) and  you may become pregnant, seek counseling before you get pregnant.  Take 400 to 800 micrograms (mcg) of folic acid every day if you become pregnant.  Ask for birth control (contraception) if you want to prevent pregnancy. Osteoporosis and menopause Osteoporosis is a disease in which the bones lose minerals and strength with aging. This can result in bone fractures. If you are 65 years old or older, or if you are at risk for osteoporosis and fractures, ask your health care provider if you should:  Be screened for bone loss.  Take a calcium or vitamin D supplement to lower your risk of fractures.  Be given hormone replacement therapy (HRT) to treat symptoms of menopause. Follow these instructions at home: Lifestyle  Do not use any products that contain nicotine or tobacco, such as cigarettes, e-cigarettes, and chewing tobacco. If you need help quitting, ask your health care provider.  Do not use street drugs.  Do not share needles.  Ask your health care provider for help if you need support or information about quitting drugs. Alcohol use  Do not drink alcohol if: ? Your health care provider tells you not to drink. ? You are pregnant, may be pregnant, or are planning to become pregnant.  If you drink alcohol: ? Limit how much you use to 0-1 drink a day. ? Limit intake if you are breastfeeding.  Be aware of how much alcohol is in your drink. In the [place] one drink equals one 12 oz bottle of beer (355 mL), one 5 oz glass of wine (148 mL), or one 1 oz glass of hard liquor (44 mL). General instructions  Schedule regular health, dental, and eye exams.  Stay current with your vaccines.  Tell your health care provider if: ? You often feel depressed. ? You have ever been abused or do not feel safe at home. Summary  Adopting a healthy lifestyle and getting preventive care are important in promoting health and wellness.  Follow your health care provider's instructions about healthy  diet, exercising, and getting tested or screened for diseases.  Follow your health care provider's instructions on monitoring your cholesterol and blood pressure. This information is not intended to replace advice given to you by your health care provider. Make sure you discuss any questions you have with your health care provider. Document Revised: 04/29/2018 Document Reviewed: 04/29/2018 Elsevier Patient Education  2020 [employer]  

## 2020-05-16 ENCOUNTER — Other Ambulatory Visit: Payer: Self-pay | Admitting: Nurse Practitioner

## 2020-05-16 DIAGNOSIS — B3731 Acute candidiasis of vulva and vagina: Secondary | ICD-10-CM

## 2020-05-16 LAB — CYTOLOGY - PAP
Chlamydia: NEGATIVE
Comment: NEGATIVE
Comment: NORMAL
Diagnosis: UNDETERMINED — AB
Neisseria Gonorrhea: NEGATIVE

## 2020-05-16 MED ORDER — FLUCONAZOLE 150 MG PO TABS: 150.0000 mg | ORAL_TABLET | Freq: Once | ORAL | 0 refills | Status: AC

## 2021-05-08 ENCOUNTER — Ambulatory Visit: Payer: BLUE CROSS/BLUE SHIELD | Admitting: Nurse Practitioner

## 2021-05-08 NOTE — Progress Notes (Deleted)
° °  Legaci Tarman Grunder 29-May-2000 [id number]   History:  20 y.o. G0 presents for annual exam. Monthly cycles. ASCUS on initial pap last year. Has received at least 2 doses of Gardasil.   Gynecologic History No LMP recorded. (Menstrual status: Oral contraceptives).   Contraception/Family planning: OCP (estrogen/progesterone) Sexually active: ***  Health Maintenance Last Pap: 05/10/2020. Results were: ASCUS Last mammogram: Not indicated Last colonoscopy: Not indicated Last Dexa: Not indicated  Past medical history, past surgical history, family history and social history were all reviewed and documented in the EPIC chart.  ROS:  A ROS was performed and pertinent positives and negatives are included.  Exam:  There were no vitals filed for this visit. There is no height or weight on file to calculate BMI.  General appearance:  Normal Thyroid:  Symmetrical, normal in size, without palpable masses or nodularity. Respiratory  Auscultation:  Clear without wheezing or rhonchi Cardiovascular  Auscultation:  Regular rate, without rubs, murmurs or gallops  Edema/varicosities:  Not grossly evident Abdominal  Soft,nontender, without masses, guarding or rebound.  Liver/spleen:  No organomegaly noted  Hernia:  None appreciated  Skin  Inspection:  Grossly normal Breasts: Not indicated per guidelines Genitourinary   Inguinal/mons:  Normal without inguinal adenopathy  External genitalia:  Normal appearing vulva with no masses, tenderness, or lesions  BUS/Urethra/Skene's glands:  Normal  Vagina:  Normal appearing with normal color and discharge, no lesions  Cervix:  Normal appearing without discharge or lesions  Uterus:  Normal in size, shape and contour.  Midline and mobile, nontender  Adnexa/parametria:     Rt: Normal in size, without masses or tenderness.   Lt: Normal in size, without masses or tenderness.  Anus and perineum: Normal  Patient informed chaperone available to be present  for breast and pelvic exam. Patient has requested no chaperone to be present. Patient has been advised what will be completed during breast and pelvic exam.   Assessment/Plan:  20 y.o. G0 for annual exam.    Return in 1 year for annual.   Tanette Chauca A Kayleb Warshaw DNP, 1:40 PM 05/08/2021

## 2021-09-28 ENCOUNTER — Other Ambulatory Visit (HOSPITAL_COMMUNITY)
Admission: RE | Admit: 2021-09-28 | Discharge: 2021-09-28 | Disposition: A | Discharge status: Home / Self Care | Payer: Self-pay | Source: Ambulatory Visit | Attending: Radiology | Admitting: Radiology

## 2021-09-28 ENCOUNTER — Ambulatory Visit: Payer: Self-pay | Admitting: Radiology

## 2021-09-28 ENCOUNTER — Encounter: Payer: Self-pay | Admitting: Radiology

## 2021-09-28 VITALS — BP 112/76 | HR 75 | Ht 65.5 in | Wt 160.0 lb

## 2021-09-28 DIAGNOSIS — Z3041 Encounter for surveillance of contraceptive pills: Secondary | ICD-10-CM

## 2021-09-28 DIAGNOSIS — Z01419 Encounter for gynecological examination (general) (routine) without abnormal findings: Secondary | ICD-10-CM

## 2021-09-28 DIAGNOSIS — R87610 Atypical squamous cells of undetermined significance on cytologic smear of cervix (ASC-US): Secondary | ICD-10-CM | POA: Insufficient documentation

## 2021-09-28 MED ORDER — DROSPIRENONE-ETHINYL ESTRADIOL 3-0.02 MG PO TABS: 1.0000 | ORAL_TABLET | Freq: Every day | ORAL | 4 refills | Status: DC

## 2021-09-28 NOTE — Progress Notes (Signed)
? ?  Leah Harrison 20-Sep-2000 [id number] ? ? ?History:  21 y.o. G0 presents for annual exam. Struggling with acne on her chest, differin gel not helping.  ? ?Gynecologic History ?No LMP recorded. (Menstrual status: Oral contraceptives). ?  ?Contraception/Family planning: OCP (estrogen/progesterone) ?Sexually active: yes ?Last Pap: 2021. Results were: abnormal ? ? ?Obstetric History ?OB History  ?Gravida Para Term Preterm AB Living  ?0 0 0 0 0 0  ?SAB IAB Ectopic Multiple Live Births  ?0 0 0 0    ? ? ? ?The following portions of the patient's history were reviewed and updated as appropriate: allergies, current medications, past family history, past medical history, past social history, past surgical history, and problem list. ? ?Review of Systems ?Pertinent items noted in HPI and remainder of comprehensive ROS otherwise negative.  ? ?Past medical history, past surgical history, family history and social history were all reviewed and documented in the EPIC chart. ? ? ?Exam: ? ?Vitals:  ? 09/28/21 1113  ?BP: 112/76  ?Pulse: 75  ?SpO2: 98%  ?Weight: 160 lb (72.6 kg)  ?Height: 5' 5.5" (1.664 m)  ? ?Body mass index is 26.22 kg/m?. ? ?General appearance:  Normal ?Thyroid:  Symmetrical, normal in size, without palpable masses or nodularity. ?Respiratory ? Auscultation:  Clear without wheezing or rhonchi ?Cardiovascular ? Auscultation:  Regular rate, without rubs, murmurs or gallops ? Edema/varicosities:  Not grossly evident ?Abdominal ? Soft,nontender, without masses, guarding or rebound. ? Liver/spleen:  No organomegaly noted ? Hernia:  None appreciated ? Skin ? Inspection:  Grossly normal ?Breasts: Examined lying and sitting.  ? Right: Without masses, retractions, nipple discharge or axillary adenopathy. ? ? Left: Without masses, retractions, nipple discharge or axillary adenopathy. ?Genitourinary  ? Inguinal/mons:  Normal without inguinal adenopathy ? External genitalia:  Normal appearing vulva with no masses,  tenderness, or lesions ? BUS/Urethra/Skene's glands:  Normal without masses or exudate ? Vagina:  Normal appearing with normal color and discharge, no lesions ? Cervix:  Normal appearing without discharge or lesions ? Uterus:  Normal in size, shape and contour.  Mobile, nontender ? Adnexa/parametria:   ?  Rt: Normal in size, without masses or tenderness. ?  Lt: Normal in size, without masses or tenderness. ? Anus and perineum: Normal ?  ?Patient informed chaperone available to be present for breast and pelvic exam. Patient has requested no chaperone to be present. Patient has been advised what will be completed during breast and pelvic exam.  ? ?Assessment/Plan:   ?1. Well woman exam with routine gynecological exam ? ? ?2. Atypical squamous cells of undetermined significance (ASCUS) on Papanicolaou smear of cervix ? ?- Cytology - PAP( [facility]) ? ?3. Encounter for surveillance of contraceptive pills ?Will switch to Yaz to improve acne on her chest ?- drospirenone-ethinyl estradiol (YAZ) 3-0.02 MG tablet; Take 1 tablet by mouth daily.  Dispense: 84 tablet; Refill: 4 ?  ? ? ?Discussed SBE, colonoscopy and DEXA screening as directed/appropriate. Recommend of exercise weekly, including weight bearing exercise. Encouraged the use of seatbelts and sunscreen. ?Return in 1 year for annual or as needed.  ? ?Tanda Rockers WHNP-BC 11:28 AM 09/28/2021  ?

## 2021-10-03 LAB — CYTOLOGY - PAP
Adequacy: ABSENT
Chlamydia: NEGATIVE
Comment: NEGATIVE
Comment: NEGATIVE
Comment: NORMAL
Diagnosis: NEGATIVE
Neisseria Gonorrhea: NEGATIVE
Trichomonas: NEGATIVE

## 2022-01-23 ENCOUNTER — Encounter: Payer: Self-pay | Admitting: Internal Medicine

## 2022-02-26 ENCOUNTER — Encounter: Payer: Self-pay | Admitting: Internal Medicine

## 2022-03-11 ENCOUNTER — Encounter: Payer: Self-pay | Admitting: Internal Medicine

## 2022-09-24 ENCOUNTER — Other Ambulatory Visit: Payer: Self-pay

## 2022-09-24 DIAGNOSIS — Z3041 Encounter for surveillance of contraceptive pills: Secondary | ICD-10-CM

## 2022-09-24 MED ORDER — DROSPIRENONE-ETHINYL ESTRADIOL 3-0.02 MG PO TABS: 1.0000 | ORAL_TABLET | Freq: Every day | ORAL | 0 refills | Status: AC

## 2022-09-24 NOTE — Telephone Encounter (Signed)
Last AEX 09/28/2021 with Clearnce Hasten, NP.  Patient has moved to [place] but needs one more refill of bcp until her appt.  Pharmacy is set.

## 2023-07-15 ENCOUNTER — Encounter: Payer: Self-pay | Admitting: Internal Medicine
